# Patient Record
Sex: Female | Born: 1975 | Race: White | Hispanic: No | Marital: Married
Health system: Southern US, Community
[De-identification: ages and names within clinical notes are randomized; demographics above are authoritative.]

---

## 2010-12-30 ENCOUNTER — Ambulatory Visit: Payer: Self-pay

## 2017-04-17 ENCOUNTER — Other Ambulatory Visit: Payer: Self-pay | Admitting: Obstetrics & Gynecology

## 2017-04-17 DIAGNOSIS — Z1231 Encounter for screening mammogram for malignant neoplasm of breast: Secondary | ICD-10-CM

## 2017-04-27 ENCOUNTER — Ambulatory Visit
Admission: RE | Admit: 2017-04-27 | Discharge: 2017-04-27 | Disposition: A | Discharge status: Home / Self Care | Payer: 59 | Source: Ambulatory Visit | Attending: Obstetrics & Gynecology | Admitting: Obstetrics & Gynecology

## 2017-04-27 ENCOUNTER — Encounter: Payer: Self-pay | Admitting: Radiology

## 2017-04-27 DIAGNOSIS — Z1231 Encounter for screening mammogram for malignant neoplasm of breast: Secondary | ICD-10-CM | POA: Diagnosis present

## 2018-04-06 ENCOUNTER — Other Ambulatory Visit: Payer: Self-pay | Admitting: Family Medicine

## 2018-04-06 ENCOUNTER — Other Ambulatory Visit: Payer: Self-pay | Admitting: Obstetrics & Gynecology

## 2018-04-06 DIAGNOSIS — Z1231 Encounter for screening mammogram for malignant neoplasm of breast: Secondary | ICD-10-CM

## 2018-04-30 ENCOUNTER — Ambulatory Visit
Admission: RE | Admit: 2018-04-30 | Discharge: 2018-04-30 | Disposition: A | Discharge status: Home / Self Care | Payer: BLUE CROSS/BLUE SHIELD | Source: Ambulatory Visit | Attending: Family Medicine | Admitting: Family Medicine

## 2018-04-30 DIAGNOSIS — Z1231 Encounter for screening mammogram for malignant neoplasm of breast: Secondary | ICD-10-CM | POA: Insufficient documentation

## 2019-06-19 ENCOUNTER — Other Ambulatory Visit: Payer: Self-pay | Admitting: Family Medicine

## 2019-06-19 DIAGNOSIS — Z1231 Encounter for screening mammogram for malignant neoplasm of breast: Secondary | ICD-10-CM

## 2019-08-13 ENCOUNTER — Ambulatory Visit
Admission: RE | Admit: 2019-08-13 | Discharge: 2019-08-13 | Disposition: A | Discharge status: Home / Self Care | Payer: Managed Care, Other (non HMO) | Source: Ambulatory Visit | Attending: Family Medicine | Admitting: Family Medicine

## 2019-08-13 DIAGNOSIS — Z1231 Encounter for screening mammogram for malignant neoplasm of breast: Secondary | ICD-10-CM | POA: Diagnosis present

## 2020-08-04 ENCOUNTER — Other Ambulatory Visit: Payer: Self-pay | Admitting: Obstetrics & Gynecology

## 2020-08-04 DIAGNOSIS — Z1231 Encounter for screening mammogram for malignant neoplasm of breast: Secondary | ICD-10-CM

## 2020-09-15 ENCOUNTER — Other Ambulatory Visit: Payer: Self-pay

## 2020-09-15 ENCOUNTER — Ambulatory Visit
Admission: RE | Admit: 2020-09-15 | Discharge: 2020-09-15 | Disposition: A | Discharge status: Home / Self Care | Payer: Managed Care, Other (non HMO) | Source: Ambulatory Visit | Attending: Obstetrics & Gynecology | Admitting: Obstetrics & Gynecology

## 2020-09-15 DIAGNOSIS — Z1231 Encounter for screening mammogram for malignant neoplasm of breast: Secondary | ICD-10-CM | POA: Insufficient documentation

## 2021-03-11 ENCOUNTER — Other Ambulatory Visit: Payer: Self-pay | Admitting: Family Medicine

## 2021-03-11 DIAGNOSIS — M542 Cervicalgia: Secondary | ICD-10-CM

## 2021-03-11 DIAGNOSIS — M5412 Radiculopathy, cervical region: Secondary | ICD-10-CM

## 2021-10-20 ENCOUNTER — Other Ambulatory Visit: Payer: Self-pay | Admitting: Family Medicine

## 2021-10-20 DIAGNOSIS — Z1231 Encounter for screening mammogram for malignant neoplasm of breast: Secondary | ICD-10-CM

## 2021-11-24 ENCOUNTER — Other Ambulatory Visit: Payer: Self-pay

## 2021-11-24 ENCOUNTER — Ambulatory Visit
Admission: RE | Admit: 2021-11-24 | Discharge: 2021-11-24 | Disposition: A | Discharge status: Home / Self Care | Payer: Managed Care, Other (non HMO) | Source: Ambulatory Visit | Attending: Family Medicine | Admitting: Family Medicine

## 2021-11-24 DIAGNOSIS — Z1231 Encounter for screening mammogram for malignant neoplasm of breast: Secondary | ICD-10-CM | POA: Diagnosis not present

## 2021-11-29 ENCOUNTER — Other Ambulatory Visit: Payer: Self-pay | Admitting: Family Medicine

## 2021-11-29 DIAGNOSIS — N6489 Other specified disorders of breast: Secondary | ICD-10-CM

## 2021-11-29 DIAGNOSIS — R928 Other abnormal and inconclusive findings on diagnostic imaging of breast: Secondary | ICD-10-CM

## 2021-12-16 ENCOUNTER — Other Ambulatory Visit: Payer: Self-pay

## 2021-12-16 ENCOUNTER — Ambulatory Visit
Admission: RE | Admit: 2021-12-16 | Discharge: 2021-12-16 | Disposition: A | Discharge status: Home / Self Care | Payer: Managed Care, Other (non HMO) | Source: Ambulatory Visit | Attending: Family Medicine | Admitting: Family Medicine

## 2021-12-16 DIAGNOSIS — R928 Other abnormal and inconclusive findings on diagnostic imaging of breast: Secondary | ICD-10-CM | POA: Diagnosis present

## 2021-12-16 DIAGNOSIS — N6489 Other specified disorders of breast: Secondary | ICD-10-CM | POA: Diagnosis present

## 2021-12-17 ENCOUNTER — Other Ambulatory Visit: Payer: Self-pay | Admitting: Family Medicine

## 2021-12-17 ENCOUNTER — Other Ambulatory Visit: Payer: Self-pay | Admitting: Certified Nurse Midwife

## 2021-12-21 ENCOUNTER — Other Ambulatory Visit: Payer: Self-pay | Admitting: Family Medicine

## 2021-12-21 DIAGNOSIS — N6489 Other specified disorders of breast: Secondary | ICD-10-CM

## 2022-06-20 ENCOUNTER — Inpatient Hospital Stay: Admission: RE | Admit: 2022-06-20 | Payer: Managed Care, Other (non HMO) | Source: Ambulatory Visit

## 2022-08-22 ENCOUNTER — Ambulatory Visit
Admission: RE | Admit: 2022-08-22 | Discharge: 2022-08-22 | Disposition: A | Discharge status: Home / Self Care | Payer: Managed Care, Other (non HMO) | Source: Ambulatory Visit | Attending: Family Medicine | Admitting: Family Medicine

## 2022-08-22 DIAGNOSIS — N6489 Other specified disorders of breast: Secondary | ICD-10-CM | POA: Diagnosis present

## 2023-02-01 ENCOUNTER — Other Ambulatory Visit: Payer: Self-pay | Admitting: Family Medicine

## 2023-02-01 DIAGNOSIS — Z1231 Encounter for screening mammogram for malignant neoplasm of breast: Secondary | ICD-10-CM

## 2023-02-22 ENCOUNTER — Ambulatory Visit
Admission: RE | Admit: 2023-02-22 | Discharge: 2023-02-22 | Disposition: A | Discharge status: Home / Self Care | Payer: Managed Care, Other (non HMO) | Source: Ambulatory Visit | Attending: Family Medicine | Admitting: Family Medicine

## 2023-02-22 DIAGNOSIS — Z1231 Encounter for screening mammogram for malignant neoplasm of breast: Secondary | ICD-10-CM | POA: Insufficient documentation

## 2023-04-27 NOTE — H&P (Signed)
Pre-Procedure H&P   Patient ID: Angela Dunn is a 47 y.o. female.  Gastroenterology Provider: Jaynie Collins, DO  Referring Provider: Dr. Burnett Sheng PCP: Jerl Mina, MD  Date: 04/28/2023  HPI Angela Dunn is a 47 y.o. female who presents today for Colonoscopy for Positive Cologuard .  Patient with positive Cologuard on 03/07/2023.  No personal or family history of colon cancer or colon polyps.  This is her initial colonoscopy.  Patient reports daily bowel movement without melena or hematochezia.  Hemoglobin 11 MCV 81 platelets 263,000 iron saturation 4% TIBC 357.   History reviewed. No pertinent past medical history.  History reviewed. No pertinent surgical history.  Family History No h/o GI disease or malignancy  Review of Systems  Constitutional:  Negative for activity change, appetite change, chills, diaphoresis, fatigue, fever and unexpected weight change.  HENT:  Negative for trouble swallowing and voice change.   Respiratory:  Negative for shortness of breath and wheezing.   Cardiovascular:  Negative for chest pain, palpitations and leg swelling.  Gastrointestinal:  Negative for abdominal distention, abdominal pain, anal bleeding, blood in stool, constipation, diarrhea, nausea, rectal pain and vomiting.  Musculoskeletal:  Negative for arthralgias and myalgias.  Skin:  Negative for color change and pallor.  Neurological:  Negative for dizziness, syncope and weakness.  Psychiatric/Behavioral:  Negative for confusion.   All other systems reviewed and are negative.    Medications No current facility-administered medications on file prior to encounter.   Current Outpatient Medications on File Prior to Encounter  Medication Sig Dispense Refill   amphetamine-dextroamphetamine (ADDERALL XR) 25 MG 24 hr capsule Take 25 mg by mouth every morning.     ferrous sulfate 325 (65 FE) MG EC tablet Take 325 mg by mouth 3 (three) times daily with meals.      meloxicam (MOBIC) 15 MG tablet Take 15 mg by mouth daily.     venlafaxine (EFFEXOR) 75 MG tablet Take 75 mg by mouth daily.      Pertinent medications related to GI and procedure were reviewed by me with the patient prior to the procedure   Current Facility-Administered Medications:    0.9 %  sodium chloride infusion, , Intravenous, Continuous, Jaynie Collins, DO  sodium chloride         Allergies  Allergen Reactions   Penicillins Swelling   Allergies were reviewed by me prior to the procedure  Objective   Body mass index is 31.51 kg/m. Vitals:   04/28/23 0704  BP: (!) 205/171  Pulse: (!) 104  Resp: 18  Temp: 97.8 F (36.6 C)  TempSrc: Temporal  SpO2: 100%  Weight: 91.3 kg  Height: 5\' 7"  (1.702 m)     Physical Exam Vitals and nursing note reviewed.  Constitutional:      General: She is not in acute distress.    Appearance: Normal appearance. She is not ill-appearing, toxic-appearing or diaphoretic.  HENT:     Head: Normocephalic and atraumatic.     Nose: Nose normal.     Mouth/Throat:     Mouth: Mucous membranes are moist.     Pharynx: Oropharynx is clear.  Eyes:     General: No scleral icterus.    Extraocular Movements: Extraocular movements intact.  Cardiovascular:     Rate and Rhythm: Regular rhythm. Tachycardia present.     Heart sounds: Normal heart sounds. No murmur heard.    No friction rub. No gallop.  Pulmonary:     Effort: Pulmonary effort  is normal. No respiratory distress.     Breath sounds: Normal breath sounds. No wheezing, rhonchi or rales.  Abdominal:     General: Bowel sounds are normal. There is no distension.     Palpations: Abdomen is soft.     Tenderness: There is no abdominal tenderness. There is no guarding or rebound.  Musculoskeletal:     Cervical back: Neck supple.     Right lower leg: No edema.     Left lower leg: No edema.  Skin:    General: Skin is warm and dry.     Coloration: Skin is not jaundiced or pale.   Neurological:     General: No focal deficit present.     Mental Status: She is alert and oriented to person, place, and time. Mental status is at baseline.  Psychiatric:        Mood and Affect: Mood normal.        Behavior: Behavior normal.        Thought Content: Thought content normal.        Judgment: Judgment normal.      Assessment:  Angela Dunn is a 47 y.o. female  who presents today for Colonoscopy for Positive Cologuard .  Plan:  Colonoscopy with possible intervention today  Colonoscopy with possible biopsy, control of bleeding, polypectomy, and interventions as necessary has been discussed with the patient/patient representative. Informed consent was obtained from the patient/patient representative after explaining the indication, nature, and risks of the procedure including but not limited to death, bleeding, perforation, missed neoplasm/lesions, cardiorespiratory compromise, and reaction to medications. Opportunity for questions was given and appropriate answers were provided. Patient/patient representative has verbalized understanding is amenable to undergoing the procedure.   Jaynie Collins, DO  [facility]  Portions of the record may have been created with voice recognition software. Occasional wrong-word or 'sound-a-like' substitutions may have occurred due to the inherent limitations of voice recognition software.  Read the chart carefully and recognize, using context, where substitutions may have occurred.

## 2023-04-28 ENCOUNTER — Encounter: Payer: Self-pay | Admitting: Gastroenterology

## 2023-04-28 ENCOUNTER — Other Ambulatory Visit: Payer: Self-pay | Admitting: Gastroenterology

## 2023-04-28 ENCOUNTER — Ambulatory Visit
Admission: RE | Admit: 2023-04-28 | Discharge: 2023-04-28 | Disposition: A | Discharge status: Home / Self Care | Payer: Managed Care, Other (non HMO) | Attending: Gastroenterology | Admitting: Gastroenterology

## 2023-04-28 ENCOUNTER — Other Ambulatory Visit: Payer: Self-pay

## 2023-04-28 ENCOUNTER — Ambulatory Visit: Payer: Managed Care, Other (non HMO) | Admitting: Certified Registered"

## 2023-04-28 ENCOUNTER — Encounter
Admission: RE | Disposition: A | Discharge status: Home / Self Care | Payer: Self-pay | Source: Home / Self Care | Attending: Gastroenterology

## 2023-04-28 DIAGNOSIS — R195 Other fecal abnormalities: Secondary | ICD-10-CM | POA: Insufficient documentation

## 2023-04-28 DIAGNOSIS — D123 Benign neoplasm of transverse colon: Secondary | ICD-10-CM | POA: Insufficient documentation

## 2023-04-28 DIAGNOSIS — F419 Anxiety disorder, unspecified: Secondary | ICD-10-CM | POA: Insufficient documentation

## 2023-04-28 DIAGNOSIS — K640 First degree hemorrhoids: Secondary | ICD-10-CM | POA: Diagnosis not present

## 2023-04-28 DIAGNOSIS — F909 Attention-deficit hyperactivity disorder, unspecified type: Secondary | ICD-10-CM | POA: Diagnosis not present

## 2023-04-28 DIAGNOSIS — D128 Benign neoplasm of rectum: Secondary | ICD-10-CM | POA: Insufficient documentation

## 2023-04-28 DIAGNOSIS — Z1211 Encounter for screening for malignant neoplasm of colon: Secondary | ICD-10-CM | POA: Diagnosis present

## 2023-04-28 DIAGNOSIS — K5730 Diverticulosis of large intestine without perforation or abscess without bleeding: Secondary | ICD-10-CM | POA: Diagnosis not present

## 2023-04-28 DIAGNOSIS — D1779 Benign lipomatous neoplasm of other sites: Secondary | ICD-10-CM | POA: Diagnosis not present

## 2023-04-28 HISTORY — PX: POLYPECTOMY: SHX5525

## 2023-04-28 HISTORY — PX: COLONOSCOPY WITH PROPOFOL: SHX5780

## 2023-04-28 LAB — POCT PREGNANCY, URINE: Preg Test, Ur: NEGATIVE

## 2023-04-28 SURGERY — COLONOSCOPY WITH PROPOFOL
Anesthesia: General

## 2023-04-28 MED ORDER — PROPOFOL 1000 MG/100ML IV EMUL
INTRAVENOUS | Status: AC
  Filled 2023-04-28: qty 100

## 2023-04-28 MED ORDER — SODIUM CHLORIDE 0.9 % IV SOLN: INTRAVENOUS | Status: DC

## 2023-04-28 MED ORDER — PROPOFOL 500 MG/50ML IV EMUL
INTRAVENOUS | Status: DC | PRN
  Administered 2023-04-28: 140 ug/kg/min via INTRAVENOUS

## 2023-04-28 MED ORDER — PROPOFOL 10 MG/ML IV BOLUS
INTRAVENOUS | Status: DC | PRN
  Administered 2023-04-28: 20 mg via INTRAVENOUS
  Administered 2023-04-28: 30 mg via INTRAVENOUS

## 2023-04-28 NOTE — Transfer of Care (Signed)
Immediate Anesthesia Transfer of Care Note  Patient: Angela Dunn  Procedure(s) Performed: COLONOSCOPY WITH PROPOFOL POLYPECTOMY  Patient Location: PACU  Anesthesia Type:MAC  Level of Consciousness: awake, alert , and oriented  Airway & Oxygen Therapy: Patient Spontanous Breathing and Patient connected to face mask oxygen  Post-op Assessment: Report given to RN, Post -op Vital signs reviewed and stable, and Patient moving all extremities X 4  Post vital signs: Reviewed and stable  Last Vitals:  Vitals Value Taken Time  BP See RN chart   Temp    Pulse 78   Resp 12   SpO2 100     Last Pain:  Vitals:   04/28/23 0802  TempSrc:   PainSc: 0-No pain         Complications: No notable events documented.

## 2023-04-28 NOTE — Interval H&P Note (Signed)
History and Physical Interval Note: Preprocedure H&P from 04/28/23  was reviewed and there was no interval change after seeing and examining the patient.  Written consent was obtained from the patient after discussion of risks, benefits, and alternatives. Patient has consented to proceed with Colonoscopy with possible intervention   04/28/2023 7:17 AM  Angela Dunn  has presented today for surgery, with the diagnosis of Z12.11 (ICD-10-CM) - Colon cancer screening R19.5 (ICD-10-CM) - Positive colorectal cancer screening using Cologuard test.  The various methods of treatment have been discussed with the patient and family. After consideration of risks, benefits and other options for treatment, the patient has consented to  Procedure(s): COLONOSCOPY WITH PROPOFOL (N/A) as a surgical intervention.  The patient's history has been reviewed, patient examined, no change in status, stable for surgery.  I have reviewed the patient's chart and labs.  Questions were answered to the patient's satisfaction.     Jaynie Collins

## 2023-04-28 NOTE — Anesthesia Procedure Notes (Signed)
Procedure Name: MAC Date/Time: 04/28/2023 7:24 AM  Performed by: Nelle Don, CRNAPre-anesthesia Checklist: Patient identified, Emergency Drugs available, Suction available and Patient being monitored Oxygen Delivery Method: Simple face mask

## 2023-04-28 NOTE — Anesthesia Preprocedure Evaluation (Addendum)
Anesthesia Evaluation  Patient identified by MRN, date of birth, ID band Patient awake    Reviewed: Allergy & Precautions, NPO status , Patient's Chart, lab work & pertinent test results  History of Anesthesia Complications Negative for: history of anesthetic complications  Airway Mallampati: I   Neck ROM: Full    Dental no notable dental hx.    Pulmonary neg pulmonary ROS   Pulmonary exam normal breath sounds clear to auscultation       Cardiovascular Exercise Tolerance: Good negative cardio ROS Normal cardiovascular exam Rhythm:Regular Rate:Normal     Neuro/Psych  PSYCHIATRIC DISORDERS (ADHD) Anxiety     Alcohol use disorder, average 2 beers per day, last intake 04/27/23    GI/Hepatic negative GI ROS,,,  Endo/Other  Obesity   Renal/GU negative Renal ROS     Musculoskeletal   Abdominal   Peds  Hematology negative hematology ROS (+)   Anesthesia Other Findings   Reproductive/Obstetrics                             Anesthesia Physical Anesthesia Plan  ASA: 2  Anesthesia Plan: General   Post-op Pain Management:    Induction: Intravenous  PONV Risk Score and Plan: 3 and Propofol infusion, TIVA and Treatment may vary due to age or medical condition  Airway Management Planned: Natural Airway  Additional Equipment:   Intra-op Plan:   Post-operative Plan:   Informed Consent: I have reviewed the patients History and Physical, chart, labs and discussed the procedure including the risks, benefits and alternatives for the proposed anesthesia with the patient or authorized representative who has indicated his/her understanding and acceptance.       Plan Discussed with: CRNA  Anesthesia Plan Comments: (LMA/GETA backup discussed.  Patient consented for risks of anesthesia including but not limited to:  - adverse reactions to medications - damage to eyes, teeth, lips or other oral  mucosa - nerve damage due to positioning  - sore throat or hoarseness - damage to heart, brain, nerves, lungs, other parts of body or loss of life  Informed patient about role of CRNA in peri- and intra-operative care.  Patient voiced understanding.)        Anesthesia Quick Evaluation

## 2023-04-28 NOTE — Anesthesia Postprocedure Evaluation (Signed)
Anesthesia Post Note  Patient: Angela Dunn  Procedure(s) Performed: COLONOSCOPY WITH PROPOFOL POLYPECTOMY  Patient location during evaluation: PACU Anesthesia Type: General Level of consciousness: awake and alert, oriented and patient cooperative Pain management: pain level controlled Vital Signs Assessment: post-procedure vital signs reviewed and stable Respiratory status: spontaneous breathing, nonlabored ventilation and respiratory function stable Cardiovascular status: blood pressure returned to baseline and stable Postop Assessment: adequate PO intake Anesthetic complications: no   No notable events documented.   Last Vitals:  Vitals:   04/28/23 0804 04/28/23 0826  BP:  119/86  Pulse:    Resp:    Temp: (!) 36 C   SpO2:      Last Pain:  Vitals:   04/28/23 0826  TempSrc:   PainSc: 0-No pain                 Reed Breech

## 2023-04-28 NOTE — Op Note (Signed)
[facility] Patient Name: Angela Dunn Procedure Date: 04/28/2023 7:20 AM MRN: [id number] Account #: [id number] Date of Birth: 1976-01-17 Admit Type: Outpatient Age: 47 Room: [facility] ENDO ROOM 1 Gender: Female Note Status: Finalized Instrument Name: Colonoscope [id number] Procedure:             Colonoscopy Indications:           Positive Cologuard test Providers:             Trenda Moots, DO Referring MD:          Rhona Leavens. Burnett Sheng, MD (Referring MD) Medicines:             Monitored Anesthesia Care Complications:         No immediate complications. Estimated blood loss:                         Minimal. Procedure:             Pre-Anesthesia Assessment:                        - Prior to the procedure, a History and Physical was                         performed, and patient medications and allergies were                         reviewed. The patient is competent. The risks and                         benefits of the procedure and the sedation options and                         risks were discussed with the patient. All questions                         were answered and informed consent was obtained.                         Patient identification and proposed procedure were                         verified by the physician, the nurse, the anesthetist                         and the technician in the endoscopy suite. Mental                         Status Examination: alert and oriented. Airway                         Examination: normal oropharyngeal airway and neck                         mobility. Respiratory Examination: clear to                         auscultation. CV Examination: RRR, no murmurs, no S3  or S4. Prophylactic Antibiotics: The patient does not                         require prophylactic antibiotics. Prior                         Anticoagulants: The patient has taken no anticoagulant                          or antiplatelet agents. ASA Grade Assessment: II - A                         patient with mild systemic disease. After reviewing                         the risks and benefits, the patient was deemed in                         satisfactory condition to undergo the procedure. The                         anesthesia plan was to use monitored anesthesia care                         (MAC). Immediately prior to administration of                         medications, the patient was re-assessed for adequacy                         to receive sedatives. The heart rate, respiratory                         rate, oxygen saturations, blood pressure, adequacy of                         pulmonary ventilation, and response to care were                         monitored throughout the procedure. The physical                         status of the patient was re-assessed after the                         procedure.                        After obtaining informed consent, the colonoscope was                         passed under direct vision. Throughout the procedure,                         the patient's blood pressure, pulse, and oxygen                         saturations were monitored continuously. The  Colonoscope was introduced through the anus and                         advanced to the the terminal ileum, with                         identification of the appendiceal orifice and IC                         valve. The colonoscopy was performed without                         difficulty. The patient tolerated the procedure well.                         The quality of the bowel preparation was evaluated                         using the BBPS [facility] Bowel Preparation Scale) with                         scores of: Right Colon = 3 (entire mucosa seen well                         with no residual staining, small fragments of stool or                         opaque liquid),  Transverse Colon = 3 (entire mucosa                         seen well with no residual staining, small fragments                         of stool or opaque liquid) and Left Colon = 2 (minor                         amount of residual staining, small fragments of stool                         and/or opaque liquid, but mucosa seen well). The total                         BBPS score equals 8. The quality of the bowel                         preparation was excellent. The terminal ileum,                         ileocecal valve, appendiceal orifice, and rectum were                         photographed. Findings:      The perianal and digital rectal examinations were normal. Pertinent       negatives include normal sphincter tone.      The terminal ileum appeared normal. Estimated blood loss: none.      Multiple small-mouthed diverticula were found in the recto-sigmoid colon  and sigmoid colon. Estimated blood loss: none.      There was a small lipoma, in the distal ascending colon. Estimated blood       loss: none.      Non-bleeding internal hemorrhoids were found during retroflexion. The       hemorrhoids were Grade I (internal hemorrhoids that do not prolapse).       Estimated blood loss: none.      Two sessile polyps were found in the rectum. The polyps were 3 to 7 mm       in size. These polyps were removed with a cold snare. Resection and       retrieval were complete. Estimated blood loss was minimal.      Three sessile polyps were found in the rectum, transverse colon and       hepatic flexure. The polyps were 1 to 2 mm in size. These polyps were       removed with a jumbo cold forceps. Resection and retrieval were       complete. Estimated blood loss was minimal.      The exam was otherwise without abnormality on direct and retroflexion       views.      Retroflexion in the right colon was performed. Impression:            - The examined portion of the ileum was normal.                         - Diverticulosis in the recto-sigmoid colon and in the                         sigmoid colon.                        - Small lipoma in the distal ascending colon.                        - Non-bleeding internal hemorrhoids.                        - Two 3 to 7 mm polyps in the rectum, removed with a                         cold snare. Resected and retrieved.                        - Three 1 to 2 mm polyps in the rectum, in the                         transverse colon and at the hepatic flexure, removed                         with a jumbo cold forceps. Resected and retrieved.                        - The examination was otherwise normal on direct and                         retroflexion views. Recommendation:        - Patient has a contact number available for  emergencies. The signs and symptoms of potential                         delayed complications were discussed with the patient.                         Return to normal activities tomorrow. Written                         discharge instructions were provided to the patient.                        - Discharge patient to home.                        - Resume previous diet.                        - Continue present medications.                        - Await pathology results.                        - Repeat colonoscopy for surveillance based on                         pathology results.                        - No ibuprofen, naproxen, or other non-steroidal                         anti-inflammatory drugs for 5 days after polyp removal.                        - Hold mobic/meloxicam for 5 days. Avoid alcohol                         containing products                        - Return to referring physician as previously                         scheduled.                        - The findings and recommendations were discussed with                         the patient. Procedure Code(s):     ---  Professional ---                        [phone], Colonoscopy, flexible; with removal of                         tumor(s), polyp(s), or other lesion(s) by snare                         technique  16109, 59, Colonoscopy, flexible; with biopsy, single                         or multiple Diagnosis Code(s):     --- Professional ---                        K64.0, First degree hemorrhoids                        D17.5, Benign lipomatous neoplasm of intra-abdominal                         organs                        D12.8, Benign neoplasm of rectum                        D12.3, Benign neoplasm of transverse colon (hepatic                         flexure or splenic flexure)                        R19.5, Other fecal abnormalities                        K57.30, Diverticulosis of large intestine without                         perforation or abscess without bleeding CPT copyright 2022 American Medical Association. All rights reserved. The codes documented in this report are preliminary and upon coder review may  be revised to meet current compliance requirements. Attending Participation:      I personally performed the entire procedure. Elfredia Nevins, DO Jaynie Collins DO, DO 04/28/2023 8:18:21 AM This report has been signed electronically. Number of Addenda: 0 Note Initiated On: 04/28/2023 7:20 AM Scope Withdrawal Time: 0 hours 16 minutes 56 seconds  Total Procedure Duration: 0 hours 28 minutes 12 seconds  Estimated Blood Loss:  Estimated blood loss was minimal.      [facility]

## 2023-04-29 NOTE — Progress Notes (Signed)
Non-identified Voicemail.  No Message Left. 

## 2023-05-01 ENCOUNTER — Encounter: Payer: Self-pay | Admitting: Gastroenterology

## 2024-02-12 IMAGING — MG MM DIGITAL SCREENING BILAT W/ TOMO AND CAD
8 series · 8 of 24 positions shown · non-contrast
Comparison: Previous exam(s).

CLINICAL DATA: Screening.

EXAM:
DIGITAL SCREENING BILATERAL MAMMOGRAM WITH TOMOSYNTHESIS AND CAD
TECHNIQUE: Bilateral screening digital craniocaudal and mediolateral oblique
mammograms were obtained. Bilateral screening digital breast
tomosynthesis was performed. The images were evaluated with
computer-aided detection.

[L CC synth-2D]
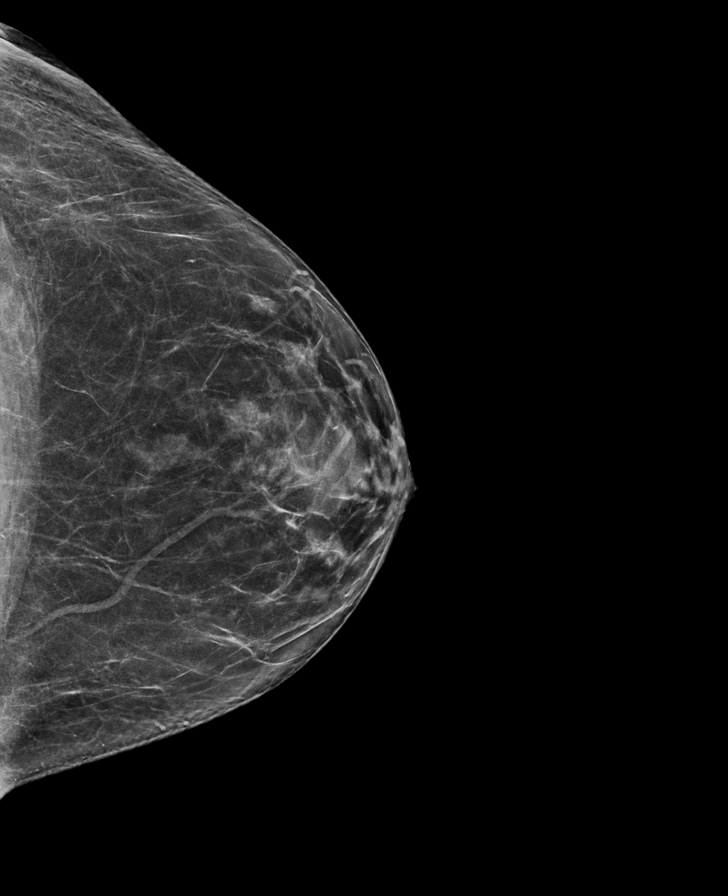

[L MLO synth-2D]
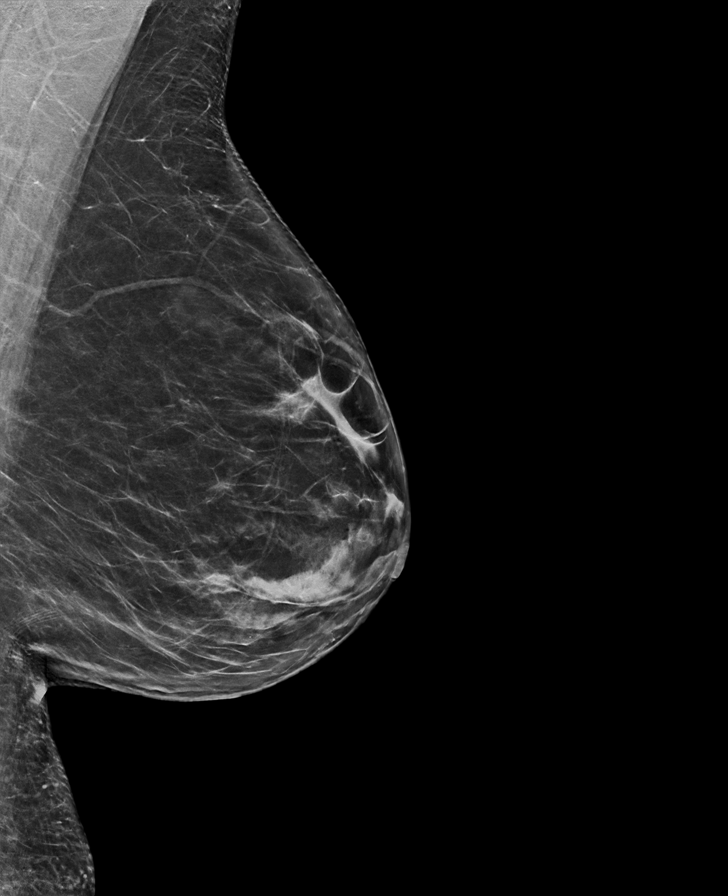

[R MLO synth-2D]
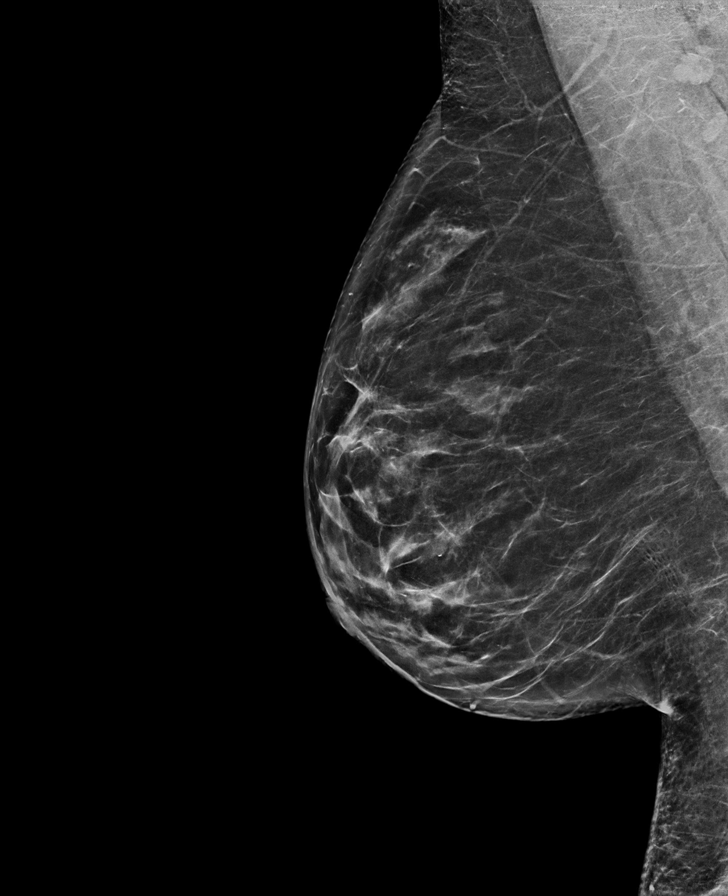

[R CC synth-2D]
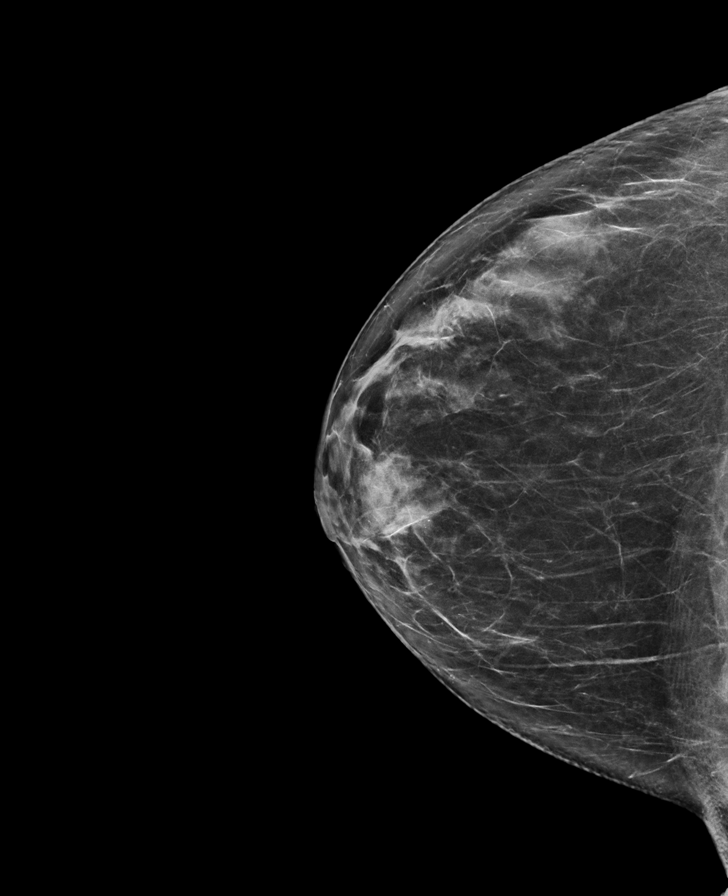

[R MLO tomo · tomo slice 41/82.0]
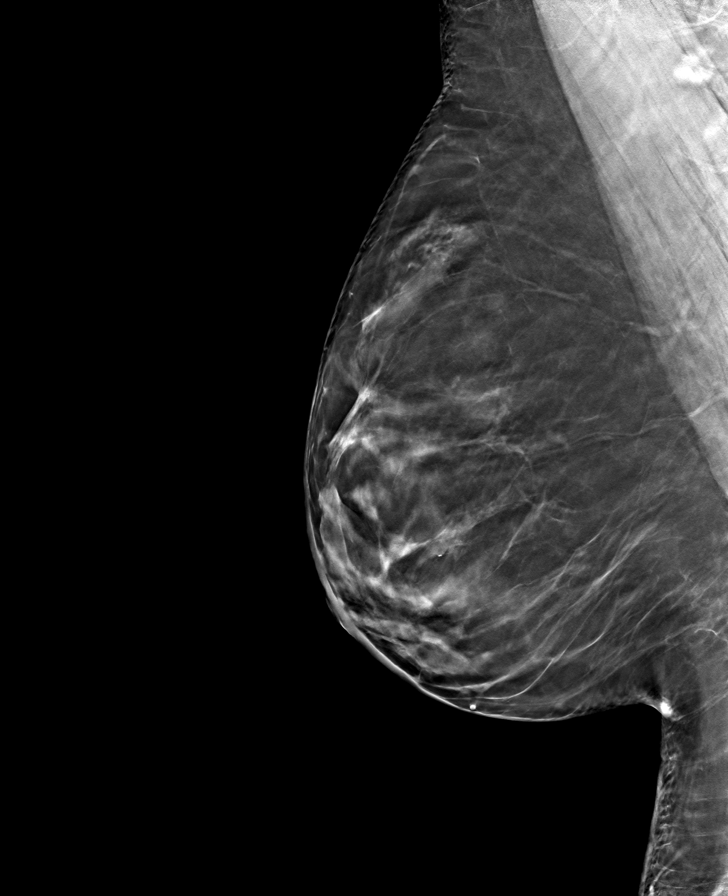

[R CC tomo · tomo slice 41/80.0]
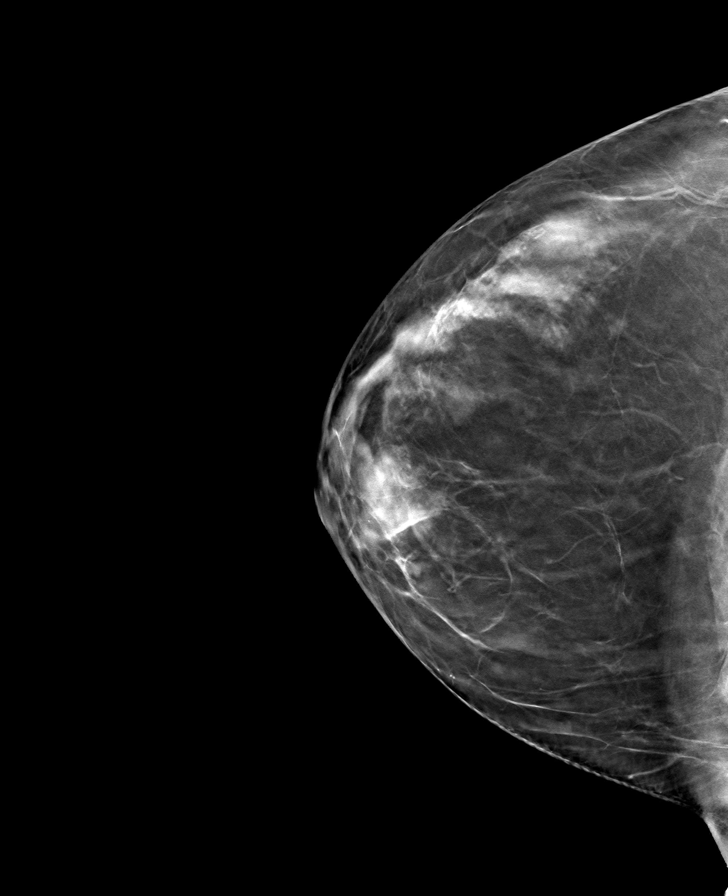

[L MLO tomo · tomo slice 39/77.0]
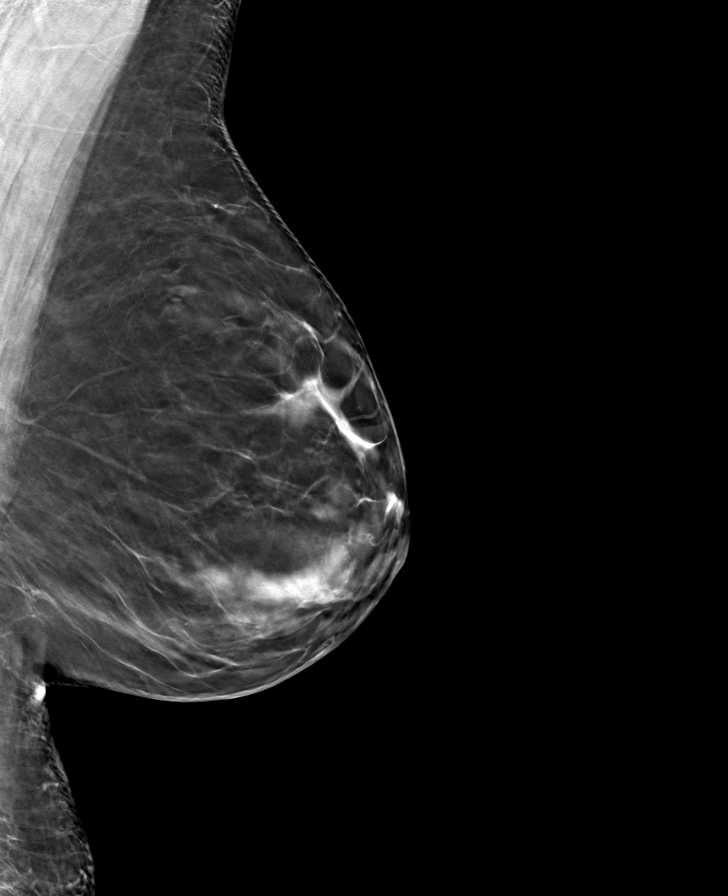

[L CC tomo · tomo slice 34/67.0]
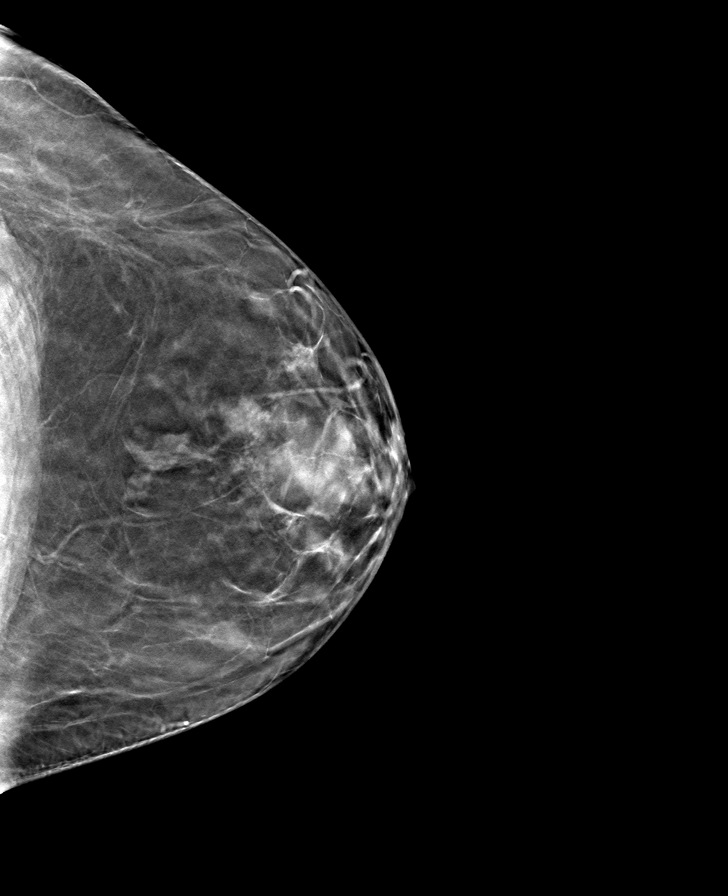

[8 of 24 positions shown; findings below may reference images not displayed]

ACR Breast Density Category c: The breast tissue is heterogeneously
dense, which may obscure small masses.
FINDINGS: In the left breast, a possible asymmetry warrants further
evaluation. In the right breast, no findings suspicious for
malignancy.
IMPRESSION: Further evaluation is suggested for possible asymmetry in the left
breast.

RECOMMENDATION:
Diagnostic mammogram and possibly ultrasound of the left breast.
(Code:8C-[id number])

The patient will be contacted regarding the findings, and additional
imaging will be scheduled.

BI-RADS CATEGORY  0: Incomplete. Need additional imaging evaluation
and/or prior mammograms for comparison.

## 2024-03-05 IMAGING — US US BREAST*L* LIMITED INC AXILLA
1 series · 13 of 13 positions shown · non-contrast
Comparison: Previous exam(s).

CLINICAL DATA: 46-year-old female presenting as a recall from
screening for possible left breast asymmetry.

EXAM:
DIGITAL DIAGNOSTIC UNILATERAL LEFT MAMMOGRAM WITH TOMOSYNTHESIS AND
CAD; ULTRASOUND LEFT BREAST LIMITED
TECHNIQUE: Left digital diagnostic mammography and breast tomosynthesis was
performed. The images were evaluated with computer-aided detection.;
Targeted ultrasound examination of the left breast was performed.

[Series 1: us breast*left* limited inc axilla · 0.06mm/px · 13 of 13 slices shown]
[im 1/13]
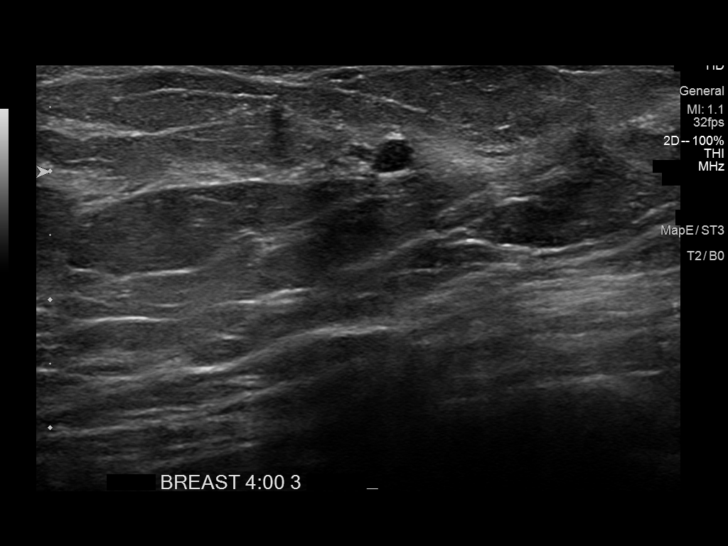
[im 2/13]
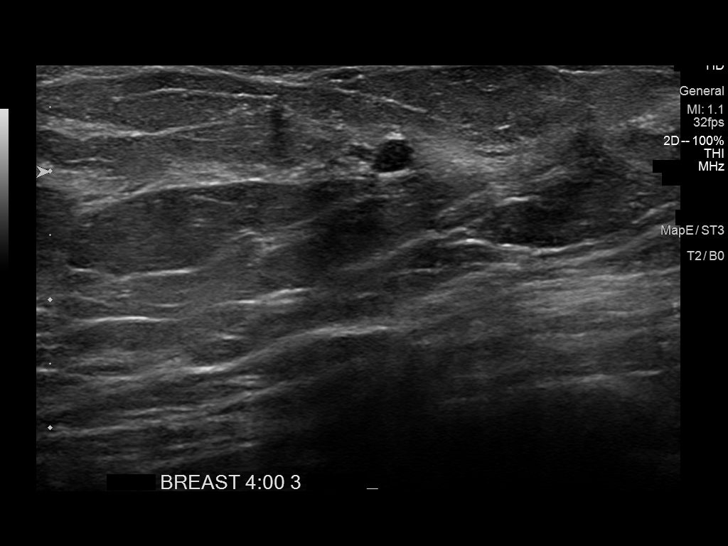
[im 3/13]
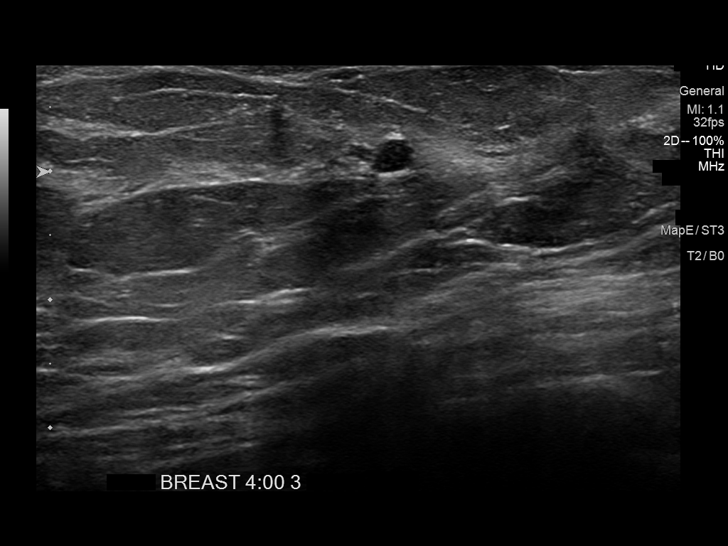
[im 4/13]
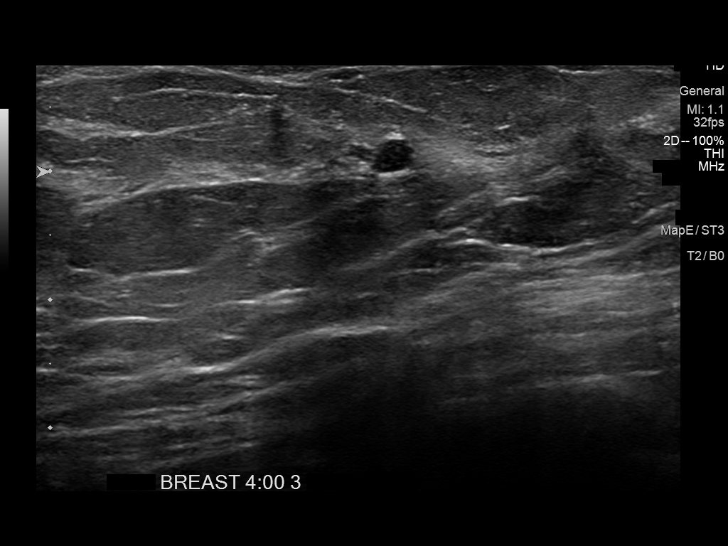
[im 5/13]
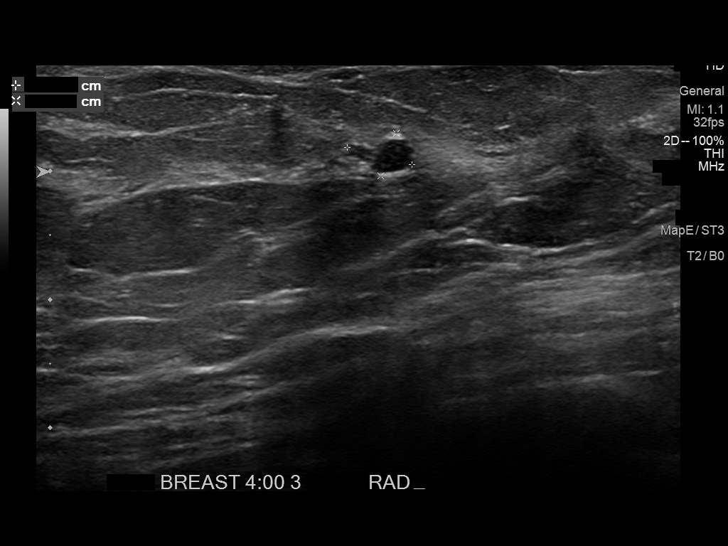
[im 6/13]
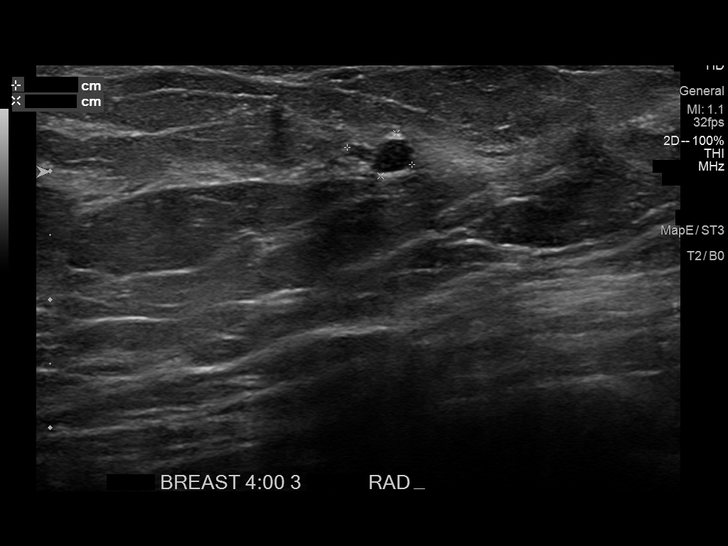
[im 7/13]
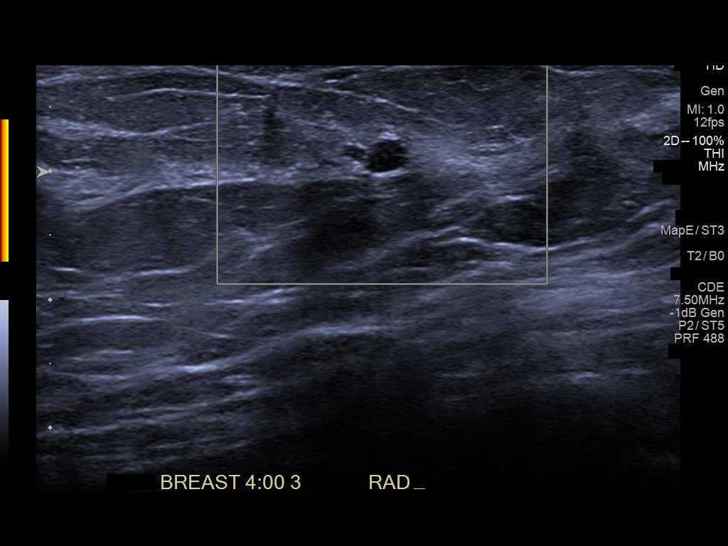
[im 8/13]
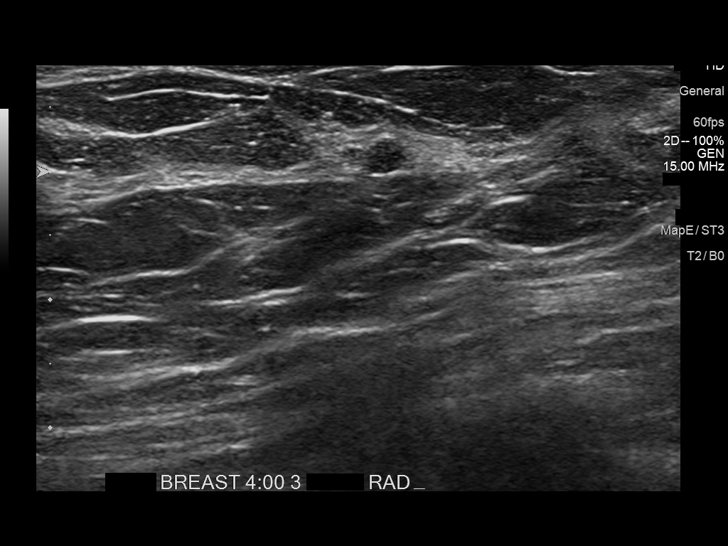
[im 9/13]
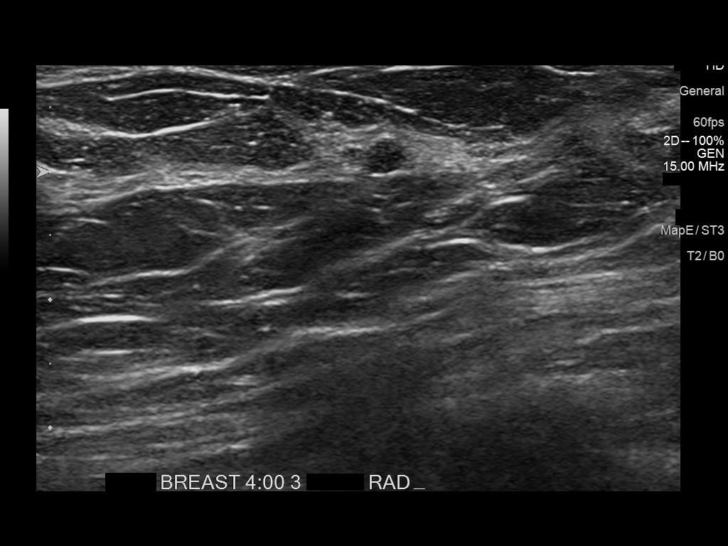
[im 10/13]
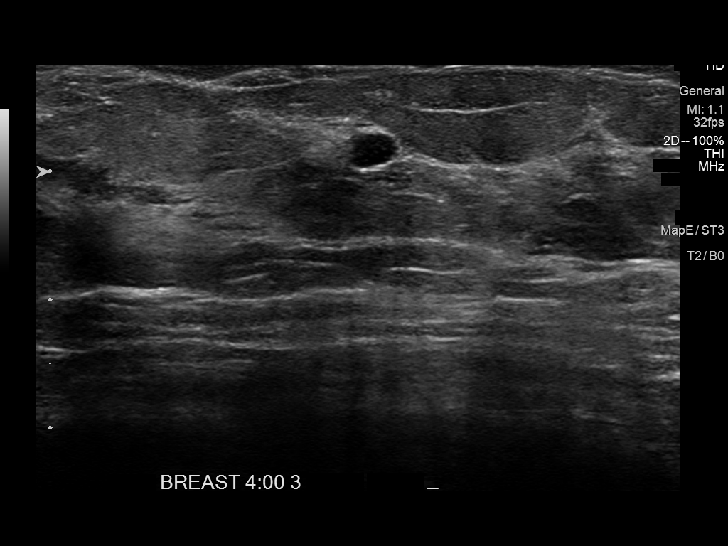
[im 11/13]
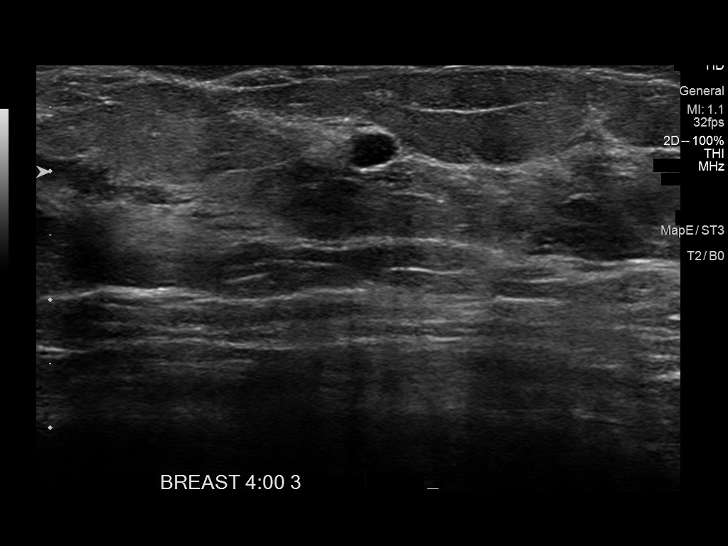
[im 12/13]
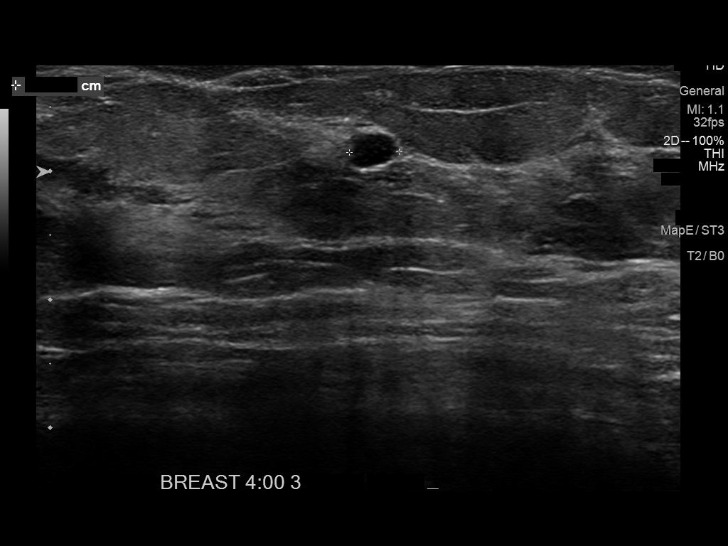
[im 13/13]
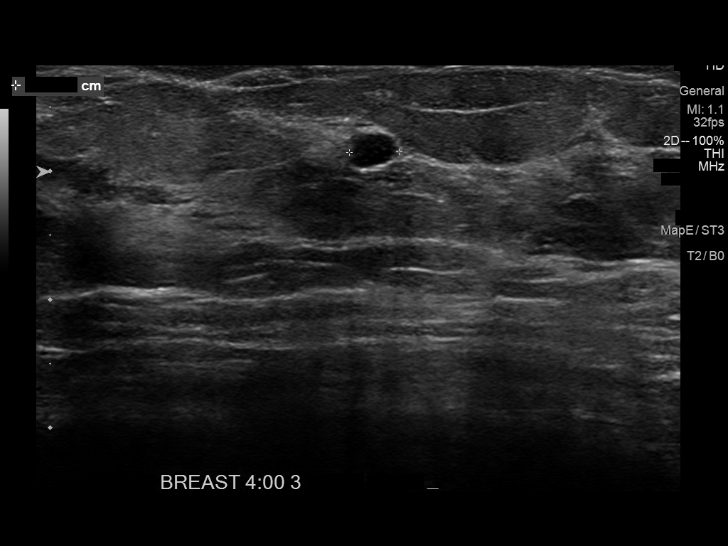

[13 of 13 positions shown; findings below may reference images not displayed]

ACR Breast Density Category c: The breast tissue is heterogeneously
dense, which may obscure small masses.
FINDINGS: Mammogram:

Spot compression tomosynthesis cc and full field mL tomosynthesis
views of the left breast were performed. There is persistence of a
small 7 mm oval asymmetry in the outer left breast, possibly
representing normal tissue.

Ultrasound:

Targeted ultrasound performed throughout the outer aspect of the
left breast demonstrating an incidental oval circumscribed anechoic
mass at 4 o'clock 3 cm from the nipple measuring 0.5 x 0.4 x 0.4 cm,
consistent with a benign cyst. No additional cystic or solid mass to
correspond to the asymmetry identified mammographically.
IMPRESSION: Probably benign asymmetry without sonographic correlate in the outer
left breast.

RECOMMENDATION:
Diagnostic left breast mammogram in 6 months.

I have discussed the findings and recommendations with the patient
who agrees to short-term follow-up. If applicable, a reminder letter
will be sent to the patient regarding the next appointment.

BI-RADS CATEGORY  3: Probably benign.
